# Patient Record
Sex: Male | Born: 1975 | Race: White | Hispanic: No | Smoking: Current every day smoker
Health system: Southern US, Community
[De-identification: ages and names within clinical notes are randomized; demographics above are authoritative.]

## PROBLEM LIST (undated history)

## (undated) DIAGNOSIS — I10 Essential (primary) hypertension: Secondary | ICD-10-CM

---

## 2005-12-11 ENCOUNTER — Emergency Department (HOSPITAL_COMMUNITY): Admission: EM | Admit: 2005-12-11 | Discharge: 2005-12-12 | Payer: Self-pay | Admitting: Emergency Medicine

## 2007-07-03 IMAGING — CR DG CHEST 2V
2 series · 2 of 2 positions shown · non-contrast
Comparison: None.

CLINICAL DATA: Fever and cough.  
 CHEST - 2 VIEW:

[w chest pa]
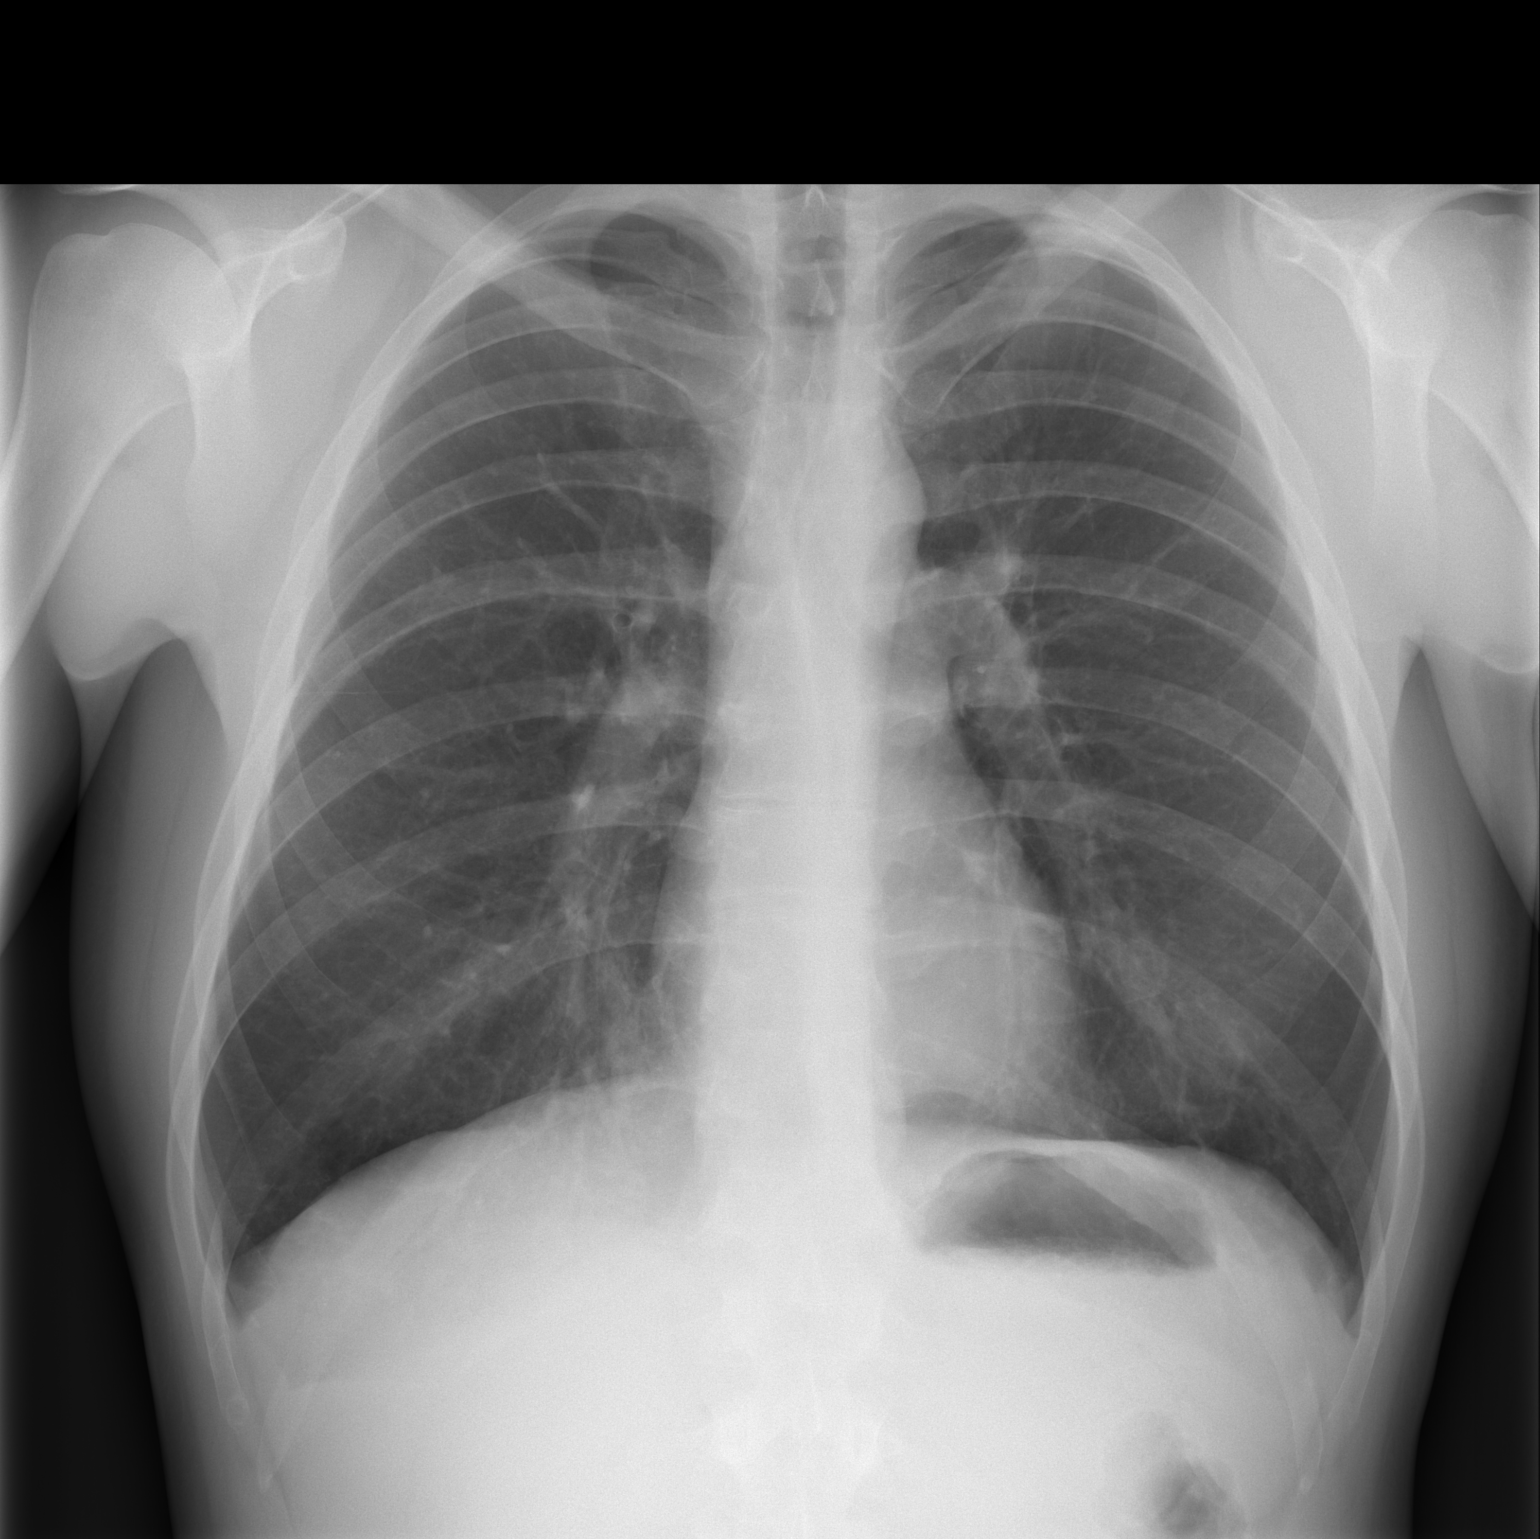

[w chest lat]
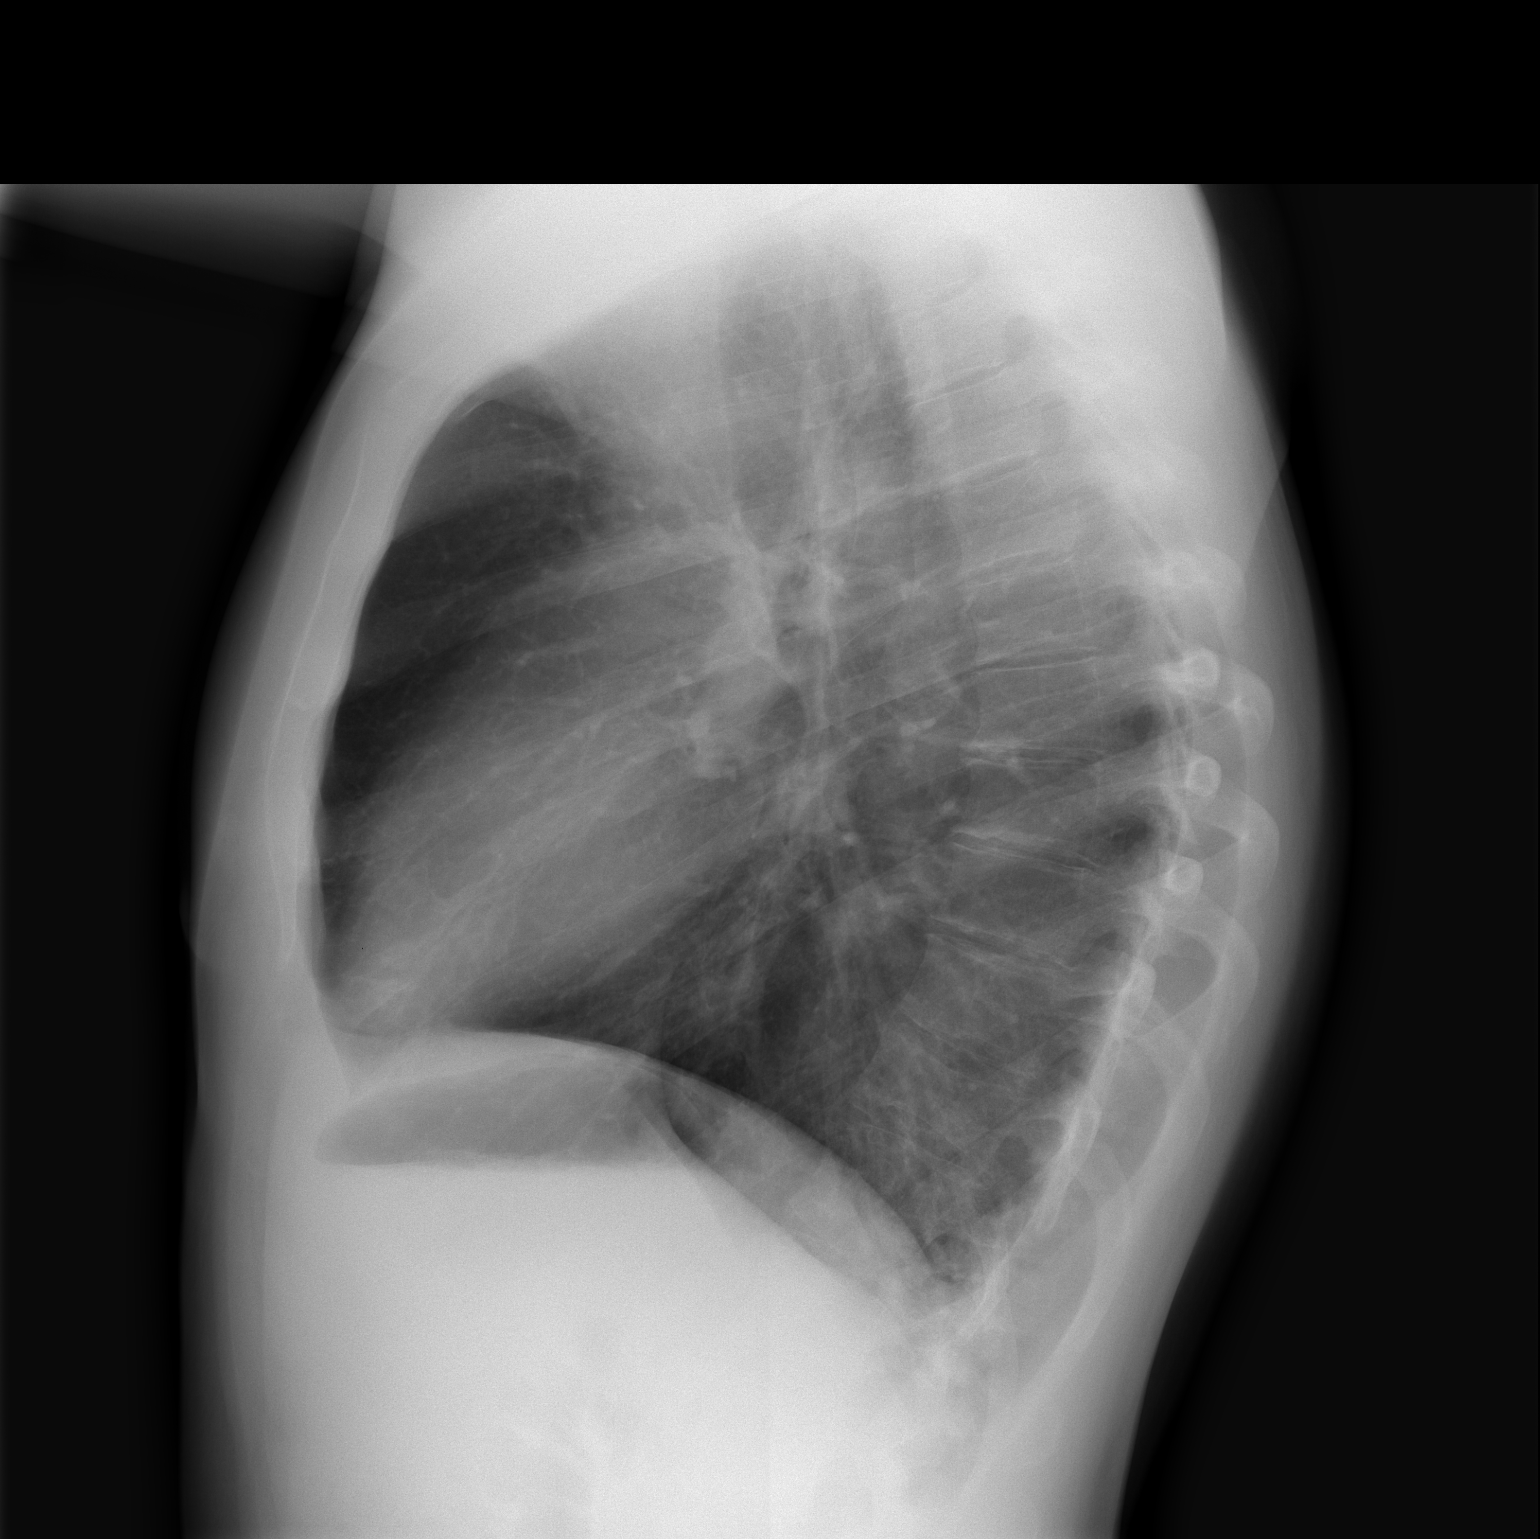

[2 of 2 positions shown; findings below may reference images not displayed]

FINDINGS: Heart size normal.  No effusions or edema.  
 There are no focal lung opacities.  
 Review of the visualized osseous structures is unremarkable.
IMPRESSION: No active cardiopulmonary disease.

## 2019-03-10 ENCOUNTER — Other Ambulatory Visit: Payer: Self-pay

## 2019-03-10 ENCOUNTER — Emergency Department (HOSPITAL_COMMUNITY)
Admission: EM | Admit: 2019-03-10 | Discharge: 2019-03-11 | Disposition: A | Discharge status: Home / Self Care | Payer: Self-pay | Attending: Emergency Medicine | Admitting: Emergency Medicine

## 2019-03-10 ENCOUNTER — Encounter (HOSPITAL_COMMUNITY): Payer: Self-pay | Admitting: *Deleted

## 2019-03-10 DIAGNOSIS — Z5321 Procedure and treatment not carried out due to patient leaving prior to being seen by health care provider: Secondary | ICD-10-CM | POA: Insufficient documentation

## 2019-03-10 DIAGNOSIS — M791 Myalgia, unspecified site: Secondary | ICD-10-CM | POA: Insufficient documentation

## 2019-03-10 HISTORY — DX: Essential (primary) hypertension: I10

## 2019-03-10 LAB — CBC
HCT: 41.3 % (ref 39.0–52.0)
Hemoglobin: 13.6 g/dL (ref 13.0–17.0)
MCH: 30 pg (ref 26.0–34.0)
MCHC: 32.9 g/dL (ref 30.0–36.0)
MCV: 91.2 fL (ref 80.0–100.0)
Platelets: 164 10*3/uL (ref 150–400)
RBC: 4.53 MIL/uL (ref 4.22–5.81)
RDW: 12.9 % (ref 11.5–15.5)
WBC: 12.4 10*3/uL — ABNORMAL HIGH (ref 4.0–10.5)
nRBC: 0 % (ref 0.0–0.2)

## 2019-03-10 LAB — BASIC METABOLIC PANEL
Anion gap: 10 (ref 5–15)
BUN: 13 mg/dL (ref 6–20)
CO2: 22 mmol/L (ref 22–32)
Calcium: 8.8 mg/dL — ABNORMAL LOW (ref 8.9–10.3)
Chloride: 103 mmol/L (ref 98–111)
Creatinine, Ser: 1.17 mg/dL (ref 0.61–1.24)
GFR calc Af Amer: >60 mL/min (ref >60)
GFR calc non Af Amer: >60 mL/min (ref >60)
Glucose, Bld: 114 mg/dL — ABNORMAL HIGH (ref 70–99)
Potassium: 3.9 mmol/L (ref 3.5–5.1)
Sodium: 135 mmol/L (ref 135–145)

## 2019-03-10 LAB — TROPONIN I: Troponin I: <0.03 ng/mL (ref <0.03)

## 2019-03-10 MED ORDER — SODIUM CHLORIDE 0.9% FLUSH: 3.0000 mL | Freq: Once | INTRAVENOUS | Status: DC

## 2019-03-10 NOTE — ED Triage Notes (Signed)
Pt reports all over body aches that started yesterday. Reports he feels like he has a fever, no meds for the same. Cough, shortness of breath, fatigue, pain in the left back and intermittently in the center of the chest. Recent travel from [place]

## 2019-03-10 NOTE — ED Notes (Signed)
Called fot pt. No response x3. Not seen in lobby.
